# Patient Record
Sex: Female | Born: 1965 | Hispanic: Yes | Marital: Single
Health system: Southern US, Community
[De-identification: ages and names within clinical notes are randomized; demographics above are authoritative.]

---

## 2020-08-06 ENCOUNTER — Ambulatory Visit: Payer: 59 | Admitting: Podiatry

## 2020-08-11 ENCOUNTER — Encounter: Payer: Self-pay | Admitting: Podiatry

## 2020-08-11 ENCOUNTER — Ambulatory Visit: Payer: 59 | Admitting: Podiatry

## 2020-08-28 ENCOUNTER — Other Ambulatory Visit: Payer: Self-pay

## 2020-08-28 ENCOUNTER — Ambulatory Visit (INDEPENDENT_AMBULATORY_CARE_PROVIDER_SITE_OTHER): Payer: 59

## 2020-08-28 ENCOUNTER — Ambulatory Visit: Payer: 59 | Admitting: Podiatry

## 2020-08-28 DIAGNOSIS — M216X9 Other acquired deformities of unspecified foot: Secondary | ICD-10-CM

## 2020-08-28 DIAGNOSIS — M79671 Pain in right foot: Secondary | ICD-10-CM

## 2020-08-28 DIAGNOSIS — M79672 Pain in left foot: Secondary | ICD-10-CM

## 2020-08-28 DIAGNOSIS — M7731 Calcaneal spur, right foot: Secondary | ICD-10-CM | POA: Diagnosis not present

## 2020-08-28 DIAGNOSIS — M7732 Calcaneal spur, left foot: Secondary | ICD-10-CM | POA: Diagnosis not present

## 2020-08-28 DIAGNOSIS — M722 Plantar fascial fibromatosis: Secondary | ICD-10-CM | POA: Diagnosis not present

## 2020-08-28 MED ORDER — DEXAMETHASONE SODIUM PHOSPHATE 120 MG/30ML IJ SOLN
8.0000 mg | Freq: Once | INTRAMUSCULAR | Status: AC
  Administered 2020-08-28: 8 mg via INTRAMUSCULAR

## 2020-08-28 MED ORDER — TRIAMCINOLONE ACETONIDE 10 MG/ML IJ SUSP
10.0000 mg | Freq: Once | INTRAMUSCULAR | Status: AC
  Administered 2020-08-28: 10 mg

## 2020-08-28 NOTE — Progress Notes (Addendum)
  Subjective:  Patient ID: Jean Liu, female    DOB: [birth year],  MRN: [id number]  No chief complaint on file.   55 y.o. female presents with the above complaint. History confirmed with patient. States she has had pain in both heels for a month now, worse on left. Previously had this pain two years ago but it went away on its own. Patient has used night splint and bracing at home without relief.  Objective:  Physical Exam: warm, good capillary refill, no trophic changes or ulcerative lesions, normal DP and PT pulses, and normal sensory exam. Left Foot: tenderness to palpation medial calcaneal tuber, no pain with calcaneal squeeze, decreased ankle joint ROM, and +Silverskiold test Right Foot: tenderness to palpation medial calcaneal tuber, no pain with calcaneal squeeze, decreased ankle joint ROM, and +Silverskiold test   Radiographs: X-ray of both feet: no evidence of calcaneal stress fracture, plantar calcaneal spur, posterior calcaneal spur, and Haglund deformity noted  Assessment:   1. Plantar fasciitis   2. Calcaneal spur of left foot   3. Calcaneal spur of right foot   4. Equinus deformity of foot    Plan:  Patient was evaluated and treated and all questions answered.  Plantar Fasciitis -XR reviewed with patient -Educated patient on stretching and icing of the affected limb -Injection delivered to the plantar fascia of both feet. -Dispense Plantar fascial rest brace x2  Procedure: Injection Tendon/Ligament Consent: Verbal consent obtained. Location: Bilateral plantar fascia at the glabrous junction; medial approach. Skin Prep: Alcohol. Injectate: 1 cc 0.5% marcaine plain, 1 cc betamethasone acetate-betamethasone sodium phosphate Disposition: Patient tolerated procedure well. Injection site dressed with a band-aid.  Return in about 3 weeks (around 09/18/2020) for Plantar fasciitis.

## 2020-08-28 NOTE — Patient Instructions (Signed)
Fascitis plantar, rehabilitacin Plantar Fasciitis Rehab Pregunte al mdico qu ejercicios son seguros para usted. Haga los ejercicios exactamente como se lo haya indicado el mdico y gradelos como se lo hayan indicado. Es normal sentir un estiramiento leve, tironeo, opresin o Dentist al [employer] ejercicios. Detngase de inmediato si siente un dolor repentino o Community education officer. No comience a hacer estos ejercicios hasta que se lo indique el mdico. Ejercicios de elongacin y amplitud de movimiento Estos ejercicios calientan los msculos y las articulaciones, y mejoran lamovilidad y la flexibilidad del pie. Adems, ayudan a Engineer, materials. Estiramiento de la fascia plantar  Sintese con la pierna izquierda/derecha cruzada sobre la rodilla [place]. Sostenga el taln con una mano, con el pulgar cerca del arco. Con la otra Horace, sostenga los dedos de los pies y empjelos con [place]. Debe sentir un estiramiento en la base (la parte de abajo) de los dedos o en la parte de abajo del pie (fascia plantar), o en ambos. Mantenga esta posicin durante ___10______ segundos. Afloje lentamente los dedos y vuelva a la posicin inicial. Repita ______2____ veces. Realice este ejercicio __3________ veces al da. Estiramiento de los gemelos, de pie Este ejercicio tambin se denomina estiramiento de la pantorrilla (los msculos gemelos). Estira los msculos posteriores de la parte superior de la pantorrilla. Prese con las [employer] pared. Extienda la pierna izquierda/derecha hacia atrs y flexione ligeramente la rodilla de la pierna de adelante. Mantenga los talones apoyados en el suelo, los dedos apuntando hacia delante y la rodilla de atrs extendida, y lleve el peso hacia la pared. No arquee la espalda. Debe sentir un ligero estiramiento en la parte superior de la pantorrilla. Mantenga esta posicin durante ___10_______ segundos. Repita ____2______ veces. Realice este ejercicio  3 veces al da. Estiramiento del msculo sleo, de pie Este ejercicio tambin se denomina estiramiento de la pantorrilla (sleo). Estira los msculos posteriores de la parte inferior de la pantorrilla. Prese con las [employer] pared. Extienda la pierna izquierda/derecha hacia atrs y flexione ligeramente la rodilla de la pierna de adelante. Mantenga los talones apoyados en el suelo y los dedos apuntando hacia delante, flexione la rodilla de atrs y lleve el peso ligeramente a la pierna de atrs. Debe sentir un estiramiento suave en la parte profunda de la parte inferior de la pantorrilla. Mantenga esta posicin durante 10 segundos. Repita 2 veces. Realice este ejercicio 3 veces al da. Estiramiento de los [employer] gemelos y sleo, de pie con un escaln Este ejercicio estira los msculos posteriores de la parte inferior de la pierna. Estos msculos se encuentran en la parte superior de la pantorrilla (gastrocnemio) y la parte inferior de la pantorrilla (sleo). Prese delante de un escaln apoyando solo la regin metatarsiana de su pie derecho/izquierdo. La regin metatarsiana del pie es la superficie sobre la que caminamos, justo debajo de los dedos. Mantenga el otro pie apoyado con firmeza en el mismo escaln. Sostngase de la pared o de una baranda para mantener el equilibrio. Levante lentamente el [employer], y permita que el peso del cuerpo presione el taln sobre el borde del frente del escaln. Mantenga la rodilla recta y sin doblar. Debe sentir un estiramiento en la pantorrilla. Mantenga esta posicin durante 10 segundos. Vuelva a poner ambos pies sobre el escaln. Repita este ejercicio con una leve flexin en la rodilla izquierda/derecha. Reptalo 2 veces con la rodilla izquierda/derecha extendida y 2 veces con la rodilla izquierda/derecha flexionada. Realice esteejercicio 3 veces  al da. Ejercicio de equilibrio Este ejercicio aumenta el equilibrio y el control de la fuerza del  arco, paraayudar a reducir la presin sobre la fascia plantar. Pararse sobre una pierna Si este ejercicio es muy fcil, puede intentar hacerlo con los ojos cerrados o parado sobre [place]. Sin calzado, prese cerca de una baranda o [place]. Puede sostenerse de la baranda o del marco de la puerta, segn lo necesite. Prese sobre el pie izquierdo/derecho. Sin despegar el dedo gordo del suelo, levante el arco del pie. Debe sentir un estiramiento en la parte de abajo del pie y el arco. No deje que el pie se vaya hacia adentro. Mantenga esta posicin durante 10 segundos. Repita 2 veces. Realice este ejercicio 10 veces al da. Esta informacin no tiene Theme park manager el consejo del mdico. Asegresede hacerle al mdico cualquier pregunta que tenga. Document Revised: 12/28/2019 Document Reviewed: 12/28/2019 Elsevier Patient Education  2022 [employer].

## 2020-10-02 ENCOUNTER — Ambulatory Visit: Payer: 59 | Admitting: Podiatry

## 2020-10-02 ENCOUNTER — Other Ambulatory Visit: Payer: Self-pay

## 2020-10-02 ENCOUNTER — Encounter: Payer: Self-pay | Admitting: Podiatry

## 2020-10-02 DIAGNOSIS — M7732 Calcaneal spur, left foot: Secondary | ICD-10-CM | POA: Diagnosis not present

## 2020-10-02 DIAGNOSIS — M722 Plantar fascial fibromatosis: Secondary | ICD-10-CM | POA: Diagnosis not present

## 2020-10-02 DIAGNOSIS — M7731 Calcaneal spur, right foot: Secondary | ICD-10-CM | POA: Diagnosis not present

## 2020-10-02 MED ORDER — METHYLPREDNISOLONE 4 MG PO TBPK: ORAL_TABLET | ORAL | 0 refills | Status: AC

## 2020-10-02 MED ORDER — MELOXICAM 15 MG PO TABS: 15.0000 mg | ORAL_TABLET | Freq: Every day | ORAL | 0 refills | Status: AC

## 2020-10-02 NOTE — Progress Notes (Signed)
  Subjective:  Patient ID: Jean Liu, female    DOB: 24-Nov-1965,  MRN: [id number]  Chief Complaint  Patient presents with   Plantar Fasciitis    The heel pain is about the same and the shots lasted two days and the braces I could not see a difference     55 y.o. female presents with the above complaint. History confirmed with patient. Presents with interpreter who assists in history.  Objective:  Physical Exam: warm, good capillary refill, no trophic changes or ulcerative lesions, normal DP and PT pulses, and normal sensory exam. Left Foot: tenderness to palpation medial calcaneal tuber, no pain with calcaneal squeeze, decreased ankle joint ROM, and +Silverskiold test Right Foot: tenderness to palpation medial calcaneal tuber, no pain with calcaneal squeeze, decreased ankle joint ROM, and +Silverskiold test   Assessment:   1. Plantar fasciitis   2. Calcaneal spur of left foot   3. Calcaneal spur of right foot    Plan:  Patient was evaluated and treated and all questions answered.  Plantar Fasciitis -Rx Medrol and Meloxicam. Advise only to start second medication after completing the steroid pack. -Continue stretching  No follow-ups on file.

## 2020-10-30 ENCOUNTER — Ambulatory Visit: Payer: 59 | Admitting: Podiatry

## 2021-05-28 ENCOUNTER — Other Ambulatory Visit: Payer: Self-pay | Admitting: Family Medicine

## 2021-05-28 DIAGNOSIS — R1902 Left upper quadrant abdominal swelling, mass and lump: Secondary | ICD-10-CM

## 2021-06-02 ENCOUNTER — Ambulatory Visit (INDEPENDENT_AMBULATORY_CARE_PROVIDER_SITE_OTHER): Payer: 59

## 2021-06-02 DIAGNOSIS — R1902 Left upper quadrant abdominal swelling, mass and lump: Secondary | ICD-10-CM

## 2021-06-02 MED ORDER — IOHEXOL 300 MG/ML  SOLN
100.0000 mL | Freq: Once | INTRAMUSCULAR | Status: AC | PRN
  Administered 2021-06-02: 100 mL via INTRAVENOUS

## 2021-07-02 ENCOUNTER — Other Ambulatory Visit: Payer: Self-pay | Admitting: Family Medicine

## 2021-07-02 DIAGNOSIS — E041 Nontoxic single thyroid nodule: Secondary | ICD-10-CM

## 2021-07-08 ENCOUNTER — Ambulatory Visit (INDEPENDENT_AMBULATORY_CARE_PROVIDER_SITE_OTHER): Payer: 59

## 2021-07-08 DIAGNOSIS — E041 Nontoxic single thyroid nodule: Secondary | ICD-10-CM | POA: Diagnosis not present

## 2023-06-21 IMAGING — CT CT CHEST-ABD-PELV W/ CM
3 of 6 series · 14 of 36 positions shown, 16 images · IV contrast (APPLIED)
Comparison: None.

CLINICAL DATA: Swelling within left upper quadrant of abdomen,
symptoms for 4 months

EXAM:
CT CHEST, ABDOMEN, AND PELVIS WITH CONTRAST
TECHNIQUE: Multidetector CT imaging of the chest, abdomen and pelvis was
performed following the standard protocol during bolus
administration of intravenous contrast.

[Series 2: cap with 2 · axial · 0.71mm/px · z∈[+901,+1381]mm · 9 of 122 slices shown, 11 images]
[im 13/122  mediastinal]
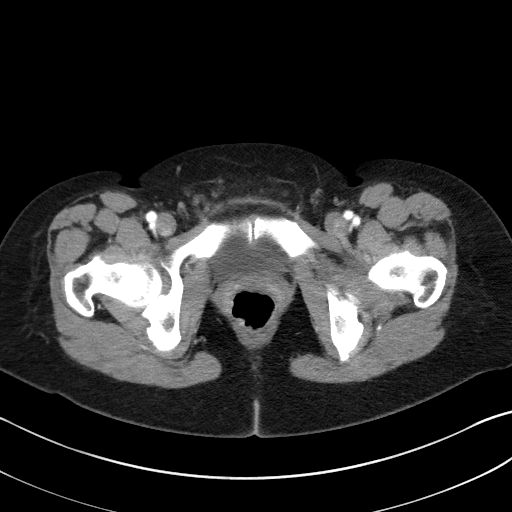
[im 13/122  bone]
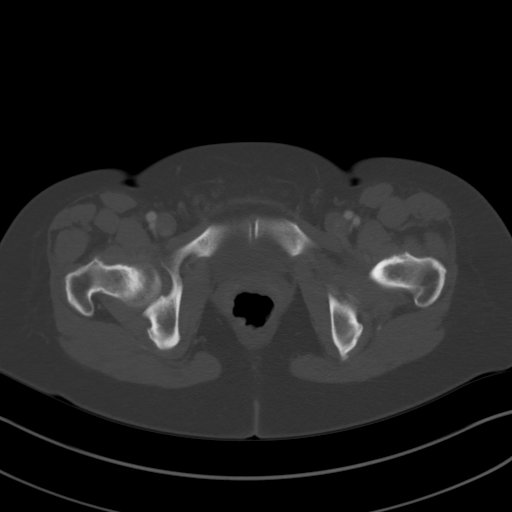
[im 25/122  mediastinal]
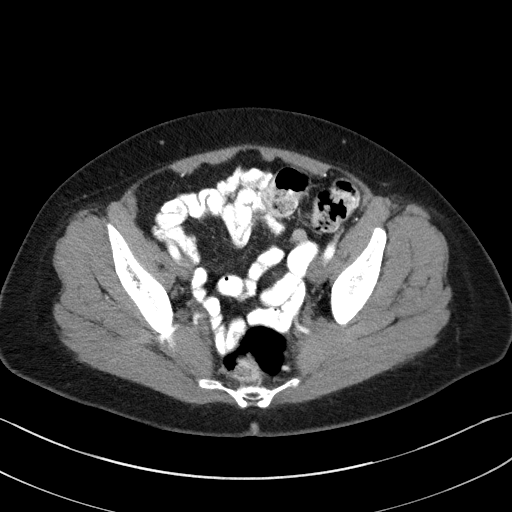
[im 37/122  mediastinal]
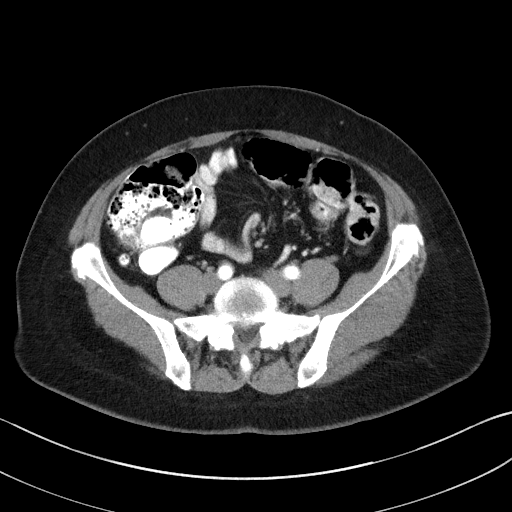
[im 49/122  mediastinal]
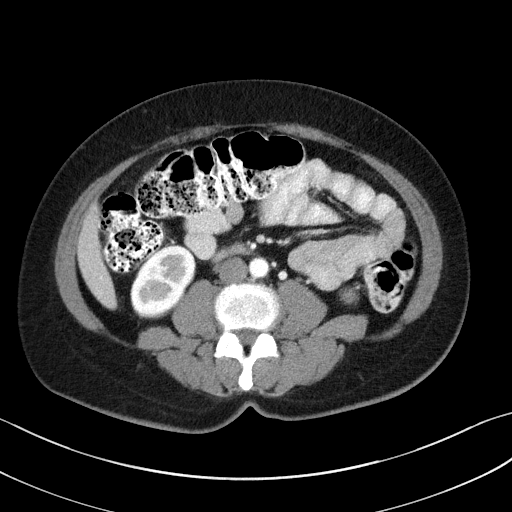
[im 61/122  mediastinal]
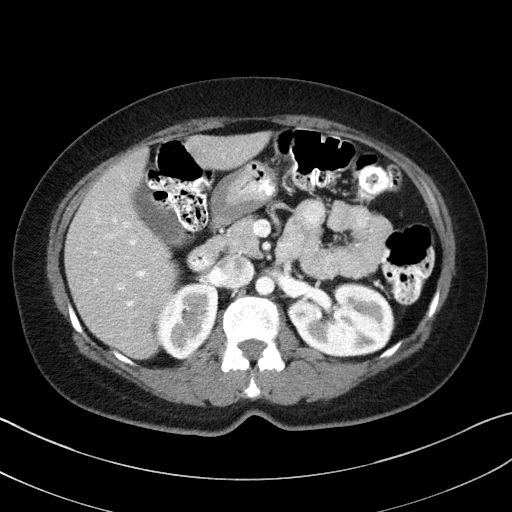
[im 73/122  mediastinal]
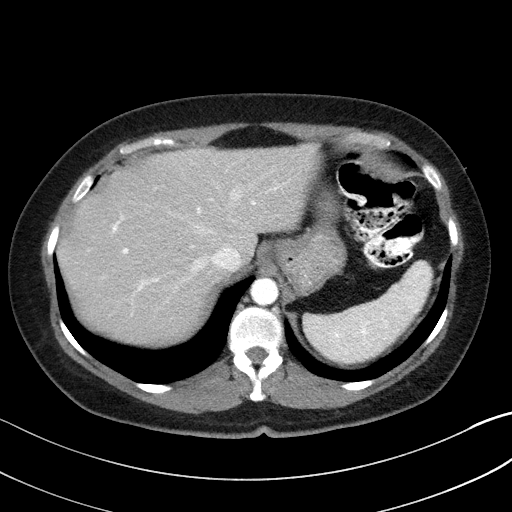
[im 85/122  mediastinal]
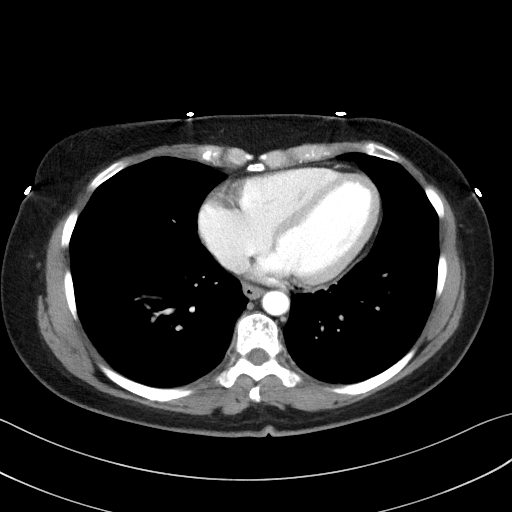
[im 97/122  mediastinal]
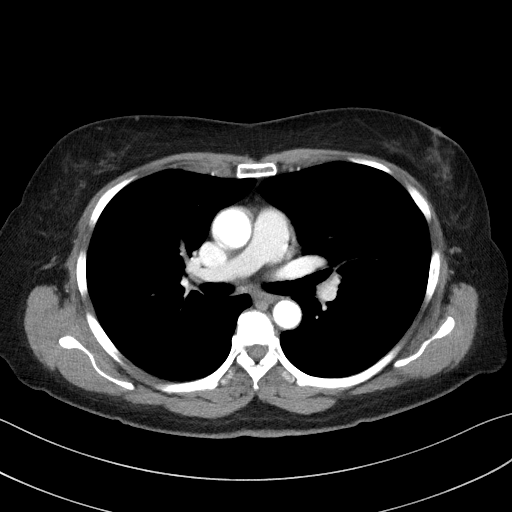
[im 109/122  mediastinal]
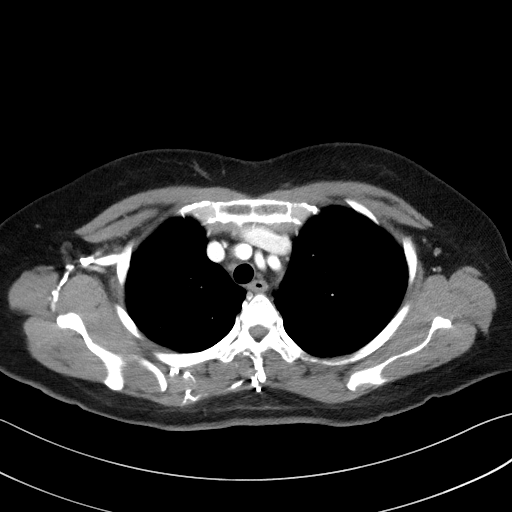
[im 109/122  bone]
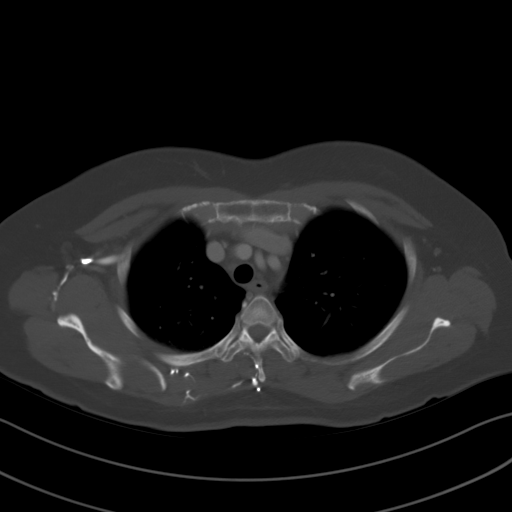

[Series 3: lung · axial · 0.71mm/px · z∈[+1186,+1212]mm · 2 of 144 slices shown]
[im 14/144  bone]
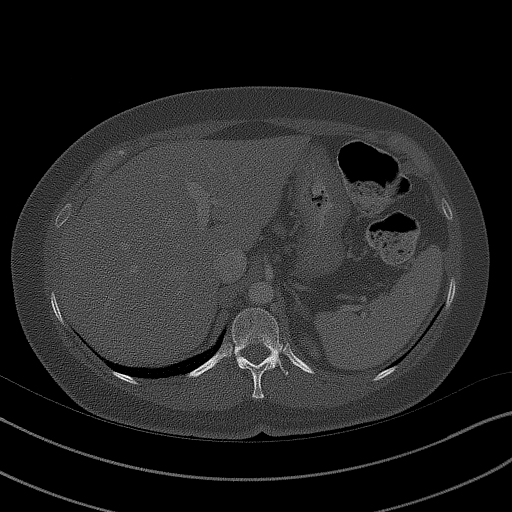
[im 27/144  bone]
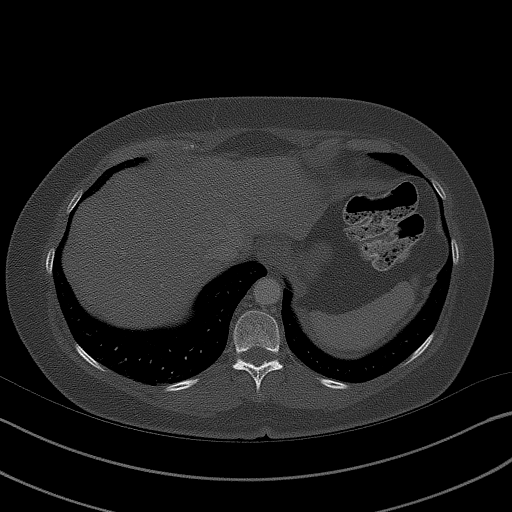

[Series 4: coronals · coronal · 0.71mm/px · 3 of 124 slices shown]
[im 25/124  mediastinal]
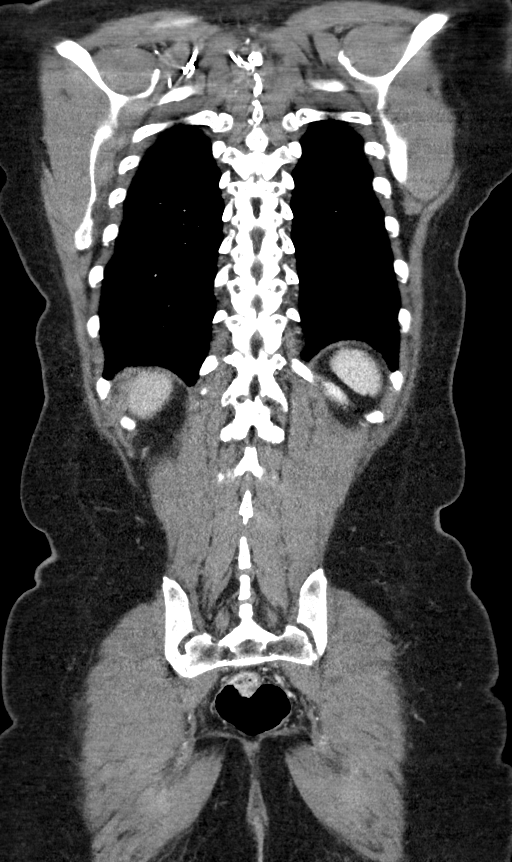
[im 50/124  mediastinal]
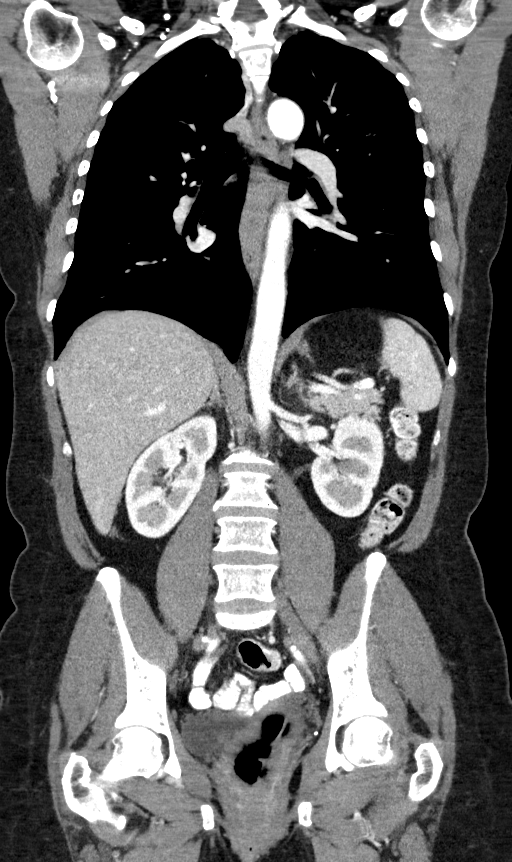
[im 74/124  mediastinal]
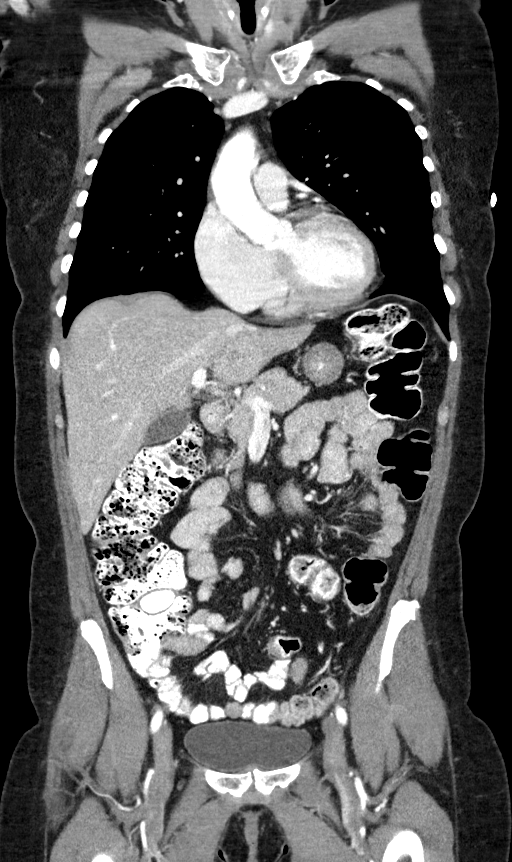

[14 of 36 positions shown; findings below may reference images not displayed]

RADIATION DOSE REDUCTION: This exam was performed according to the
departmental dose-optimization program which includes automated
exposure control, adjustment of the mA and/or kV according to
patient size and/or use of iterative reconstruction technique.

CONTRAST:  100mL OMNIPAQUE IOHEXOL 300 MG/ML  SOLN
FINDINGS: CT CHEST FINDINGS

Cardiovascular: The heart and great vessels are unremarkable without
pericardial effusion. No evidence of thoracic aortic aneurysm or
dissection.

Mediastinum/Nodes: Incidental 1.7 cm hypodense right lobe thyroid
nodule. The remainder of the thyroid, trachea, and esophagus are
unremarkable. No pathologic adenopathy.

Lungs/Pleura: No acute airspace disease, effusion, or pneumothorax.
Central airways are widely patent.

Musculoskeletal: No acute or destructive bony lesions. There is no
abnormality of the left lower chest wall at the site of the
patient's palpable finding, which was denoted by metallic markers
during the exam. In this region, underlying costochondral cartilage
appears normal. Reconstructed images demonstrate no additional
findings.

CT ABDOMEN PELVIS FINDINGS

Hepatobiliary: No focal liver abnormality is seen. No gallstones,
gallbladder wall thickening, or biliary dilatation.

Pancreas: Unremarkable. No pancreatic ductal dilatation or
surrounding inflammatory changes.

Spleen: Normal in size without focal abnormality.

Adrenals/Urinary Tract: Adrenal glands are unremarkable. Kidneys are
normal, without renal calculi, focal lesion, or hydronephrosis.
Bladder is unremarkable.

Stomach/Bowel: No bowel obstruction or ileus. Normal retrocecal
appendix. No bowel wall thickening or inflammatory change.

Vascular/Lymphatic: No significant vascular findings are present. No
enlarged abdominal or pelvic lymph nodes.

Reproductive: Status post hysterectomy. No adnexal masses.

Other: No free fluid or free gas. Small fat containing umbilical
hernia. No bowel herniation.

Musculoskeletal: No acute or destructive bony lesions. Reconstructed
images demonstrate no additional findings.
IMPRESSION: 1. No abnormality within the left lower anterolateral chest wall to
correspond to the reported palpable abnormality.
2. No acute intrathoracic, intra-abdominal, or intrapelvic process.
3. 1.7 cm incidental right thyroid nodule. Recommend thyroid US.
Reference: [HOSPITAL]. [DATE]): 143-50

## 2023-07-27 IMAGING — US US THYROID
1 series · 13 of 25 positions shown · non-contrast
Comparison: None Available.

CLINICAL DATA: 1.7 cm incidental right thyroid nodule on CT

EXAM:
THYROID ULTRASOUND
TECHNIQUE: Ultrasound examination of the thyroid gland and adjacent soft
tissues was performed.

[Series 1: us thyroid · 13 of 42 slices shown]
[im 1/42]
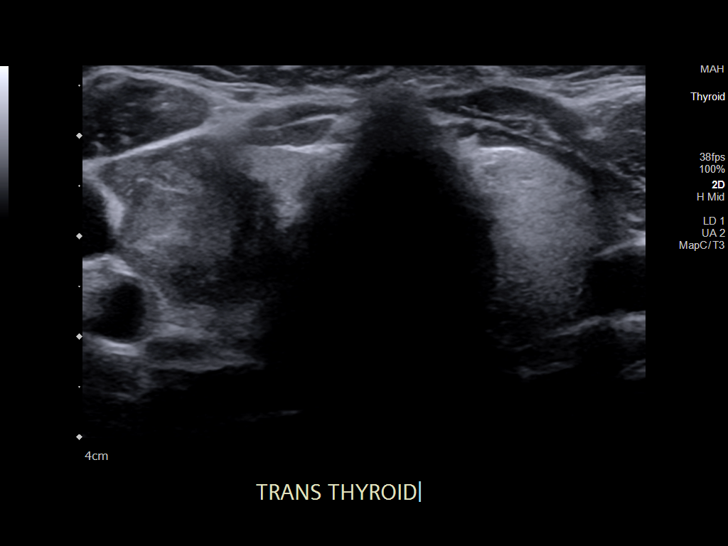
[im 4/42]
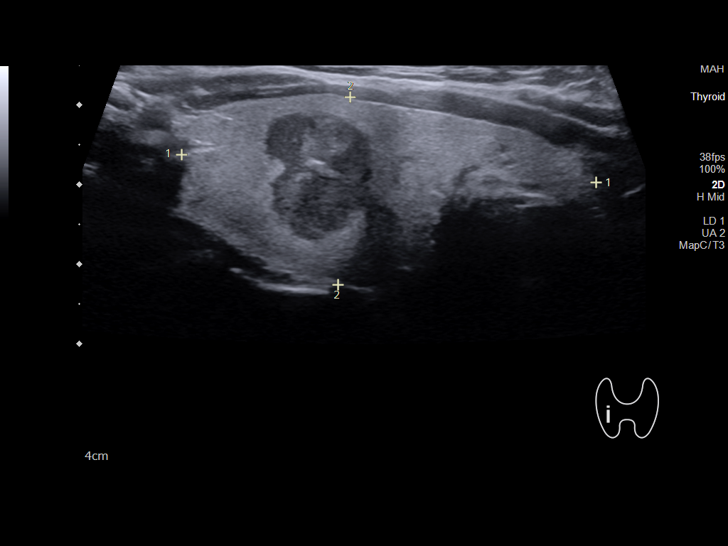
[im 7/42]
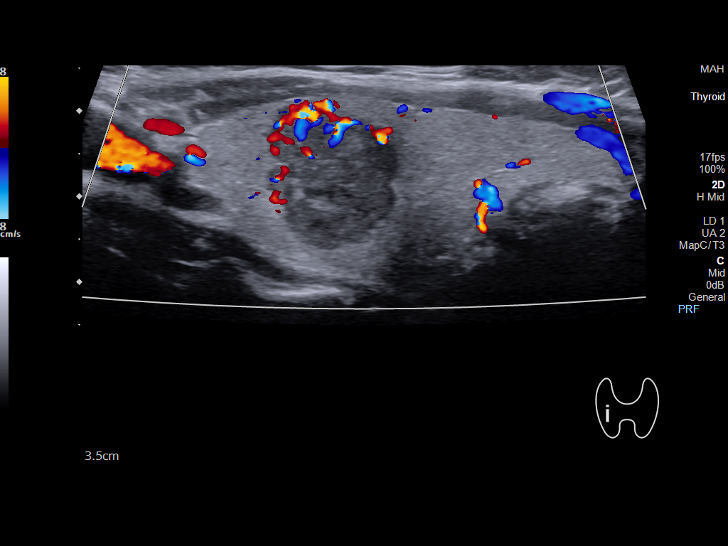
[im 11/42]
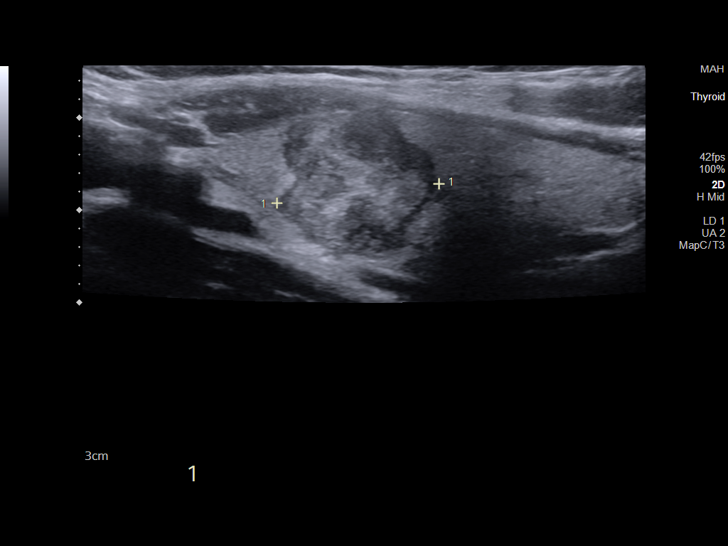
[im 14/42]
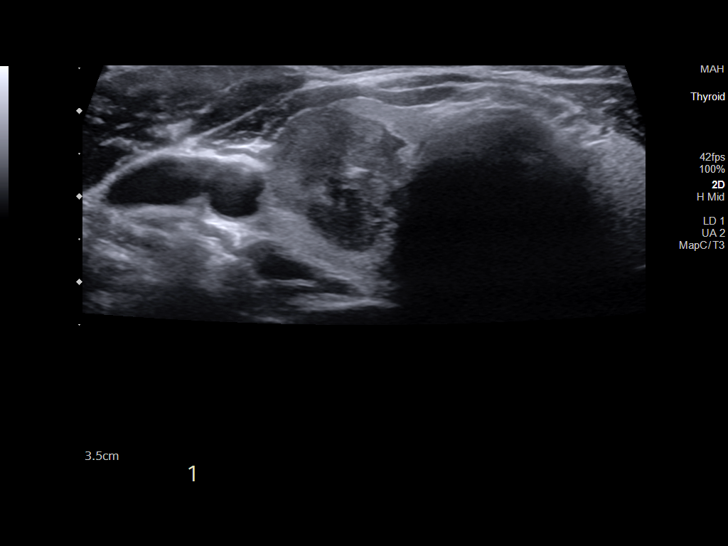
[im 18/42]
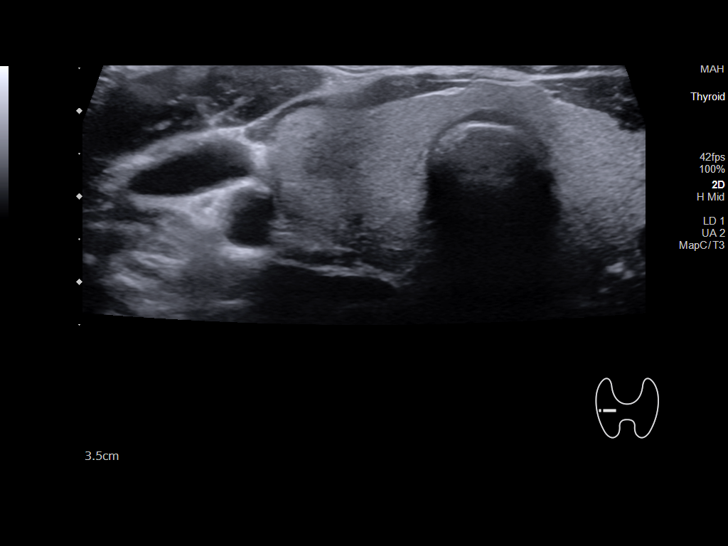
[im 21/42]
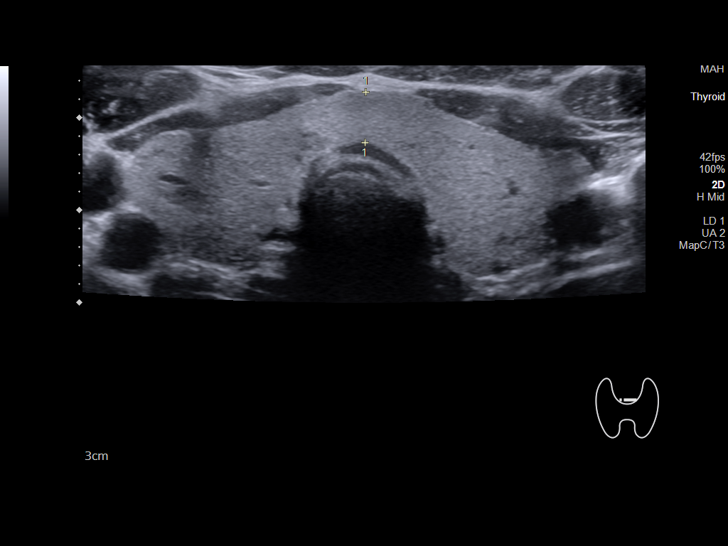
[im 24/42]
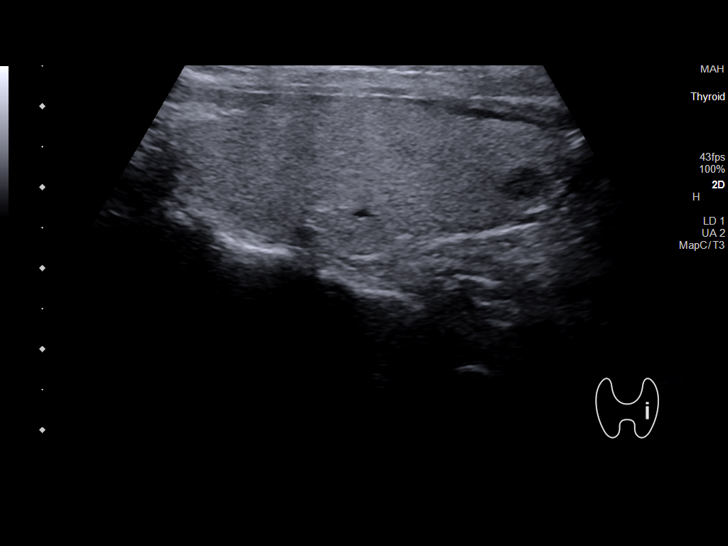
[im 28/42]
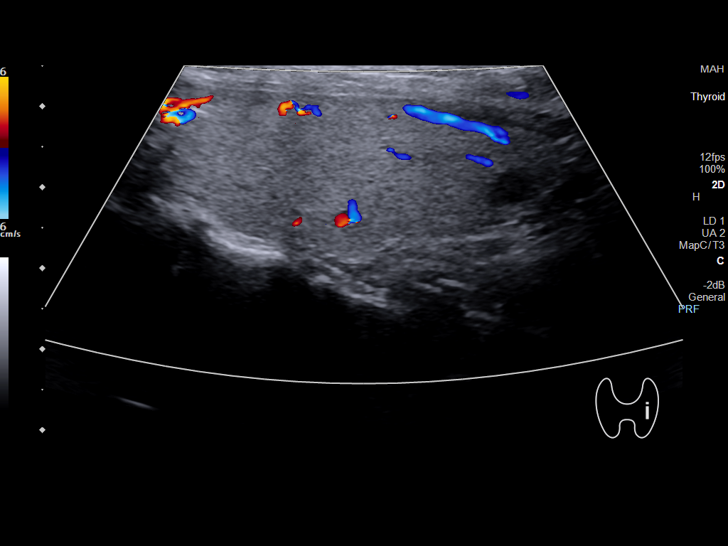
[im 31/42]
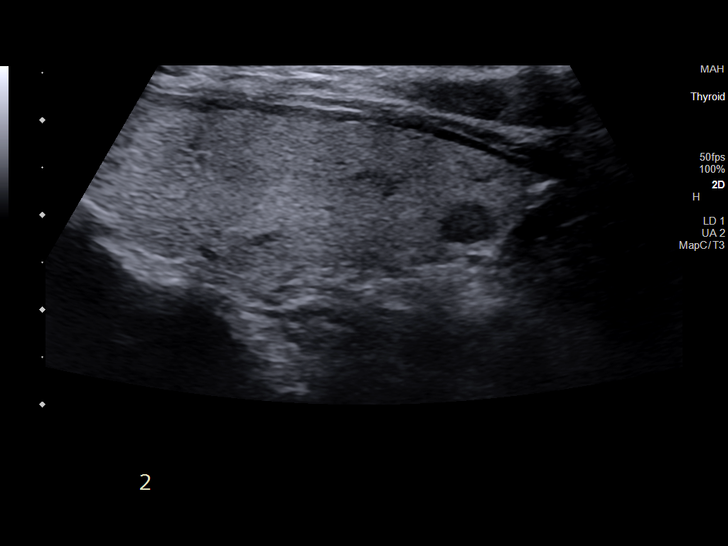
[im 35/42]
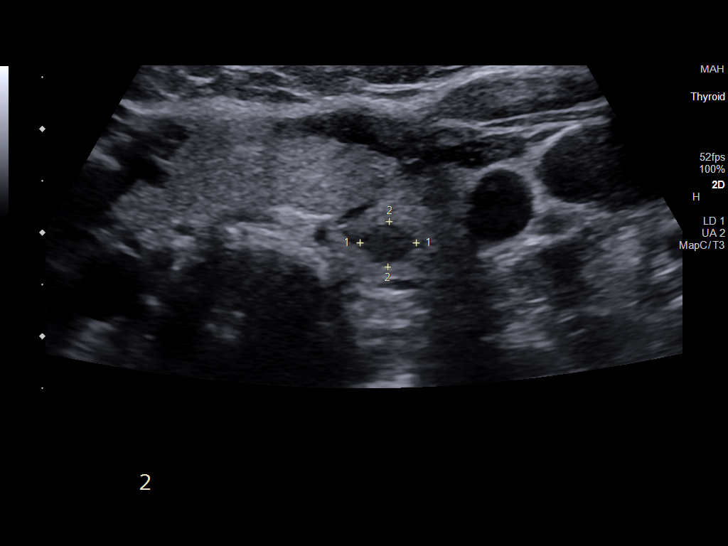
[im 38/42]
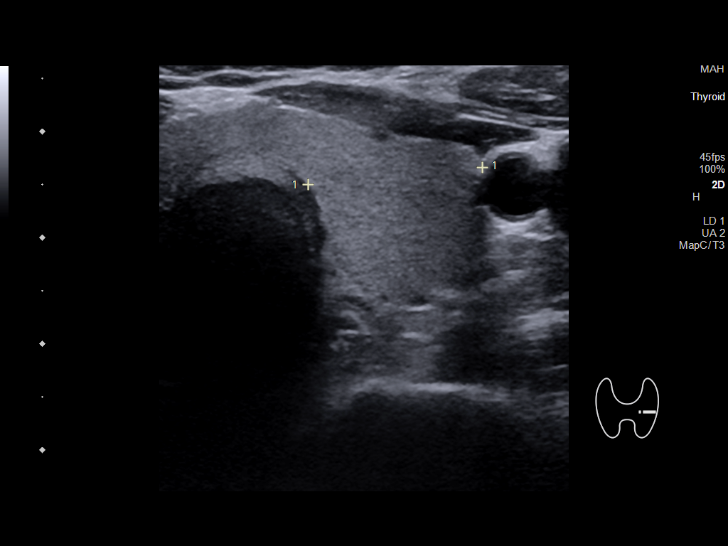
[im 42/42]
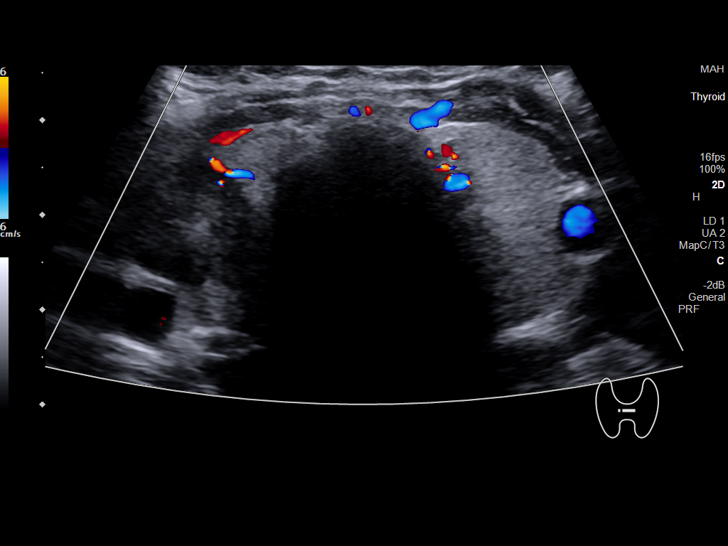

[13 of 25 positions shown; findings below may reference images not displayed]

FINDINGS: Parenchymal Echotexture: Mildly heterogenous

Isthmus: 0.6 cm

Right lobe: 5.2 x 2.4 x 1.9 cm

Left lobe: 5.0 x 2.0 x 1.7 cm

_________________________________________________________

Estimated total number of nodules >/= 1 cm: 1

Number of spongiform nodules >/=  2 cm not described below (TR1): 0

Number of mixed cystic and solid nodules >/= 1.5 cm not described
below (TR2): 0

_________________________________________________________

Nodule # 1:

Location: Right; superior

Maximum size: 1.8 cm; Other 2 dimensions: 1.8 x 1.6 cm

Composition: solid/almost completely solid (2)

Echogenicity: hypoechoic (2)

Shape: taller-than-wide (3)

Margins: smooth (0)

Echogenic foci: macrocalcifications (1)

ACR TI-RADS total points: 8.

ACR TI-RADS risk category: TR5 (>/= 7 points).

ACR TI-RADS recommendations:

**Given size (>/= 1.0 cm) and appearance, fine needle aspiration of
this highly suspicious nodule should be considered based on TI-RADS
criteria.

This nodule corresponds to the abnormality seen on recent CT.

_________________________________________________________

Nodule 2: 0.7 cm hypoechoic solid nodule of the inferior tip of the
left thyroid lobe does not meet criteria for imaging surveillance or
FNA.
IMPRESSION: Nodule 1 (TI-RADS 5), measuring 1.8 cm, located in the superior
right thyroid lobe, meets criteria for FNA.

The above is in keeping with the ACR TI-RADS recommendations - [HOSPITAL] [id number];[DATE].
